# Patient Record
Sex: Male | Born: 1960 | Race: White | Hispanic: No | Marital: Single | Smoking: Never smoker
Health system: Southern US, Community
[De-identification: ages and names within clinical notes are randomized; demographics above are authoritative.]

## PROBLEM LIST (undated history)

## (undated) DIAGNOSIS — G4730 Sleep apnea, unspecified: Secondary | ICD-10-CM

## (undated) DIAGNOSIS — C801 Malignant (primary) neoplasm, unspecified: Secondary | ICD-10-CM

## (undated) DIAGNOSIS — I10 Essential (primary) hypertension: Secondary | ICD-10-CM

## (undated) DIAGNOSIS — E119 Type 2 diabetes mellitus without complications: Secondary | ICD-10-CM

## (undated) HISTORY — PX: APPENDECTOMY: SHX54

## (undated) HISTORY — PX: CHOLECYSTECTOMY: SHX55

---

## 2008-10-04 ENCOUNTER — Emergency Department (HOSPITAL_COMMUNITY): Admission: EM | Admit: 2008-10-04 | Discharge: 2008-10-04 | Payer: Self-pay | Admitting: Emergency Medicine

## 2014-08-02 ENCOUNTER — Other Ambulatory Visit: Payer: Self-pay | Admitting: Physician Assistant

## 2019-08-26 ENCOUNTER — Ambulatory Visit: Payer: Self-pay | Attending: Family

## 2019-08-26 DIAGNOSIS — Z23 Encounter for immunization: Secondary | ICD-10-CM

## 2019-08-26 NOTE — Progress Notes (Signed)
   Covid-19 Vaccination Clinic  Name:  Joseph Matthews    MRN: [id number] DOB: 1960/11/10  08/26/2019  [name] was observed post Covid-19 immunization for 15 minutes without incident. He was provided with Vaccine Information Sheet and instruction to access the V-Safe system.   [name] was instructed to call 911 with any severe reactions post vaccine: [name] Kitchen Difficulty breathing  . Swelling of face and throat  . A fast heartbeat  . A bad rash all over body  . Dizziness and weakness   Immunizations Administered    Name Date Dose VIS Date Route   Moderna COVID-19 Vaccine 08/26/2019  1:47 PM 0.5 mL 05/18/2019 Intramuscular   Manufacturer: Moderna   Lot: [id number]   [id number]

## 2019-09-28 ENCOUNTER — Ambulatory Visit: Payer: Self-pay | Attending: Family

## 2019-09-28 DIAGNOSIS — Z23 Encounter for immunization: Secondary | ICD-10-CM

## 2019-09-28 NOTE — Progress Notes (Signed)
   Covid-19 Vaccination Clinic  Name:  Joseph Matthews    MRN: [id number] DOB: Sep 11, 1960  09/28/2019  Mr. [name] was observed post Covid-19 immunization for 15 minutes without incident. He was provided with Vaccine Information Sheet and instruction to access the V-Safe system.   Mr. [name] was instructed to call 911 with any severe reactions post vaccine: [name] Kitchen Difficulty breathing  . Swelling of face and throat  . A fast heartbeat  . A bad rash all over body  . Dizziness and weakness   Immunizations Administered    Name Date Dose VIS Date Route   Moderna COVID-19 Vaccine 09/28/2019 11:13 AM 0.5 mL 05/18/2019 Intramuscular   Manufacturer: Moderna   Lot: [id number]   [place][id number]

## 2020-03-27 ENCOUNTER — Other Ambulatory Visit (HOSPITAL_COMMUNITY): Payer: Self-pay | Admitting: Urology

## 2020-03-27 DIAGNOSIS — C61 Malignant neoplasm of prostate: Secondary | ICD-10-CM

## 2020-04-04 ENCOUNTER — Other Ambulatory Visit: Payer: Self-pay

## 2020-04-04 ENCOUNTER — Encounter (HOSPITAL_COMMUNITY)
Admission: RE | Admit: 2020-04-04 | Discharge: 2020-04-04 | Disposition: A | Discharge status: Home / Self Care | Payer: BC Managed Care – PPO | Source: Ambulatory Visit | Attending: Urology | Admitting: Urology

## 2020-04-04 DIAGNOSIS — C61 Malignant neoplasm of prostate: Secondary | ICD-10-CM | POA: Insufficient documentation

## 2020-04-04 MED ORDER — TECHNETIUM TC 99M MEDRONATE IV KIT
21.1000 | PACK | Freq: Once | INTRAVENOUS | Status: AC
  Administered 2020-04-04: 21.1 via INTRAVENOUS

## 2020-04-27 ENCOUNTER — Other Ambulatory Visit: Payer: Self-pay | Admitting: Urology

## 2020-05-16 NOTE — Progress Notes (Signed)
DUE TO COVID-19 ONLY ONE VISITOR IS ALLOWED TO COME WITH YOU AND STAY IN THE WAITING ROOM ONLY DURING PRE OP AND PROCEDURE DAY OF SURGERY. THE 1 VISITOR  MAY VISIT WITH YOU AFTER SURGERY IN YOUR PRIVATE ROOM DURING VISITING HOURS ONLY!  YOU NEED TO HAVE A COVID 19 TEST ON___12/03/2020 ____ @_______ , THIS TEST MUST BE DONE BEFORE SURGERY,  COVID TESTING SITE [address] [place] [address], IT IS ON THE RIGHT GOING OUT [address] APPROXIMATELY  2 MINUTES PAST [employer] ON THE RIGHT. ONCE YOUR COVID TEST IS COMPLETED,  PLEASE BEGIN THE QUARANTINE INSTRUCTIONS AS OUTLINED IN YOUR HANDOUT.                Joseph Matthews  05/16/2020   Your procedure is scheduled on: 05/29/2020    Report to [facility] Main  Entrance   Report to admitting at    [phone]     Call this number if you have problems the morning of surgery [phone]    Remember: Do not eat food , candy gum or mints :After Midnight. You may have clear liquids from midnight until 0415AM   MAGNESIUM cITRATE- 8  OUNCES AT 12 NOON DAY BEFORE SURGERY.   FLEETS ENEMA NITE BEFORE SURGERY     CLEAR LIQUID DIET   Foods Allowed                                                                       Coffee and tea, regular and decaf                              Plain Jell-O any favor except red or purple                                            Fruit ices (not with fruit pulp)                                      Iced Popsicles                                     Carbonated beverages, regular and diet                                    Cranberry, grape and apple juices Sports drinks like Gatorade Lightly seasoned clear broth or consume(fat free) Sugar, honey syrup   _____________________________________________________________________    BRUSH YOUR TEETH MORNING OF SURGERY AND RINSE YOUR MOUTH OUT, NO CHEWING GUM CANDY OR MINTS.     Take these medicines the morning of surgery with A SIP OF WATER:  NONE   DO NOT TAKE ANY DIABETIC MEDICATIONS DAY OF YOUR SURGERY  You may not have any metal on your body including hair pins and              piercings  Do not wear jewelry, make-up, lotions, powders or perfumes, deodorant             Do not wear nail polish on your fingernails.  Do not shave  48 hours prior to surgery.              Men may shave face and neck.   Do not bring valuables to the hospital. [place].  Contacts, dentures or bridgework may not be worn into surgery.  Leave suitcase in the car. After surgery it may be brought to your room.     Patients discharged the day of surgery will not be allowed to drive home. IF YOU ARE HAVING SURGERY AND GOING HOME THE SAME DAY, YOU MUST HAVE AN ADULT TO DRIVE YOU HOME AND BE WITH YOU FOR 24 HOURS. YOU MAY GO HOME BY TAXI OR UBER OR ORTHERWISE, BUT AN ADULT MUST ACCOMPANY YOU HOME AND STAY WITH YOU FOR 24 HOURS.  Name and phone number of your driver:  Special Instructions: N/A              Please read over the following fact sheets you were given: _____________________________________________________________________  [facility] - Preparing for Surgery Before surgery, you can play an important role.  Because skin is not sterile, your skin needs to be as free of germs as possible.  You can reduce the number of germs on your skin by washing with CHG (chlorahexidine gluconate) soap before surgery.  CHG is an antiseptic cleaner which kills germs and bonds with the skin to continue killing germs even after washing. Please DO NOT use if you have an allergy to CHG or antibacterial soaps.  If your skin becomes reddened/irritated stop using the CHG and inform your nurse when you arrive at Short Stay. Do not shave (including legs and underarms) for at least 48 hours prior to the first CHG shower.  You may shave your face/neck. Please follow these instructions  carefully:  1.  Shower with CHG Soap the night before surgery and the  morning of Surgery.  2.  If you choose to wash your hair, wash your hair first as usual with your  normal  shampoo.  3.  After you shampoo, rinse your hair and body thoroughly to remove the  shampoo.                           4.  Use CHG as you would any other liquid soap.  You can apply chg directly  to the skin and wash                       Gently with a scrungie or clean washcloth.  5.  Apply the CHG Soap to your body ONLY FROM THE NECK DOWN.   Do not use on face/ open                           Wound or open sores. Avoid contact with eyes, ears mouth and genitals (private parts).  Wash face,  Genitals (private parts) with your normal soap.             6.  Wash thoroughly, paying special attention to the area where your surgery  will be performed.  7.  Thoroughly rinse your body with warm water from the neck down.  8.  DO NOT shower/wash with your normal soap after using and rinsing off  the CHG Soap.                9.  Pat yourself dry with a clean towel.            10.  Wear clean pajamas.            11.  Place clean sheets on your bed the night of your first shower and do not  sleep with pets. Day of Surgery : Do not apply any lotions/deodorants the morning of surgery.  Please wear clean clothes to the hospital/surgery center.  FAILURE TO FOLLOW THESE INSTRUCTIONS MAY RESULT IN THE CANCELLATION OF YOUR SURGERY PATIENT SIGNATURE_________________________________  NURSE SIGNATURE__________________________________  ________________________________________________________________________

## 2020-05-17 ENCOUNTER — Other Ambulatory Visit: Payer: Self-pay

## 2020-05-17 ENCOUNTER — Encounter (HOSPITAL_COMMUNITY)
Admission: RE | Admit: 2020-05-17 | Discharge: 2020-05-17 | Disposition: A | Discharge status: Home / Self Care | Payer: BC Managed Care – PPO | Source: Ambulatory Visit | Attending: Urology | Admitting: Urology

## 2020-05-17 ENCOUNTER — Encounter (HOSPITAL_COMMUNITY): Payer: Self-pay

## 2020-05-17 DIAGNOSIS — Z01818 Encounter for other preprocedural examination: Secondary | ICD-10-CM | POA: Insufficient documentation

## 2020-05-17 HISTORY — DX: Type 2 diabetes mellitus without complications: E11.9

## 2020-05-17 HISTORY — DX: Essential (primary) hypertension: I10

## 2020-05-17 HISTORY — DX: Malignant (primary) neoplasm, unspecified: C80.1

## 2020-05-17 HISTORY — DX: Sleep apnea, unspecified: G47.30

## 2020-05-17 LAB — CBC
HCT: 44.7 % (ref 39.0–52.0)
Hemoglobin: 15.4 g/dL (ref 13.0–17.0)
MCH: 29.8 pg (ref 26.0–34.0)
MCHC: 34.5 g/dL (ref 30.0–36.0)
MCV: 86.6 fL (ref 80.0–100.0)
Platelets: 216 10*3/uL (ref 150–400)
RBC: 5.16 MIL/uL (ref 4.22–5.81)
RDW: 12.8 % (ref 11.5–15.5)
WBC: 7.5 10*3/uL (ref 4.0–10.5)
nRBC: 0 % (ref 0.0–0.2)

## 2020-05-17 LAB — BASIC METABOLIC PANEL
Anion gap: 9 (ref 5–15)
BUN: 17 mg/dL (ref 6–20)
CO2: 28 mmol/L (ref 22–32)
Calcium: 8.9 mg/dL (ref 8.9–10.3)
Chloride: 104 mmol/L (ref 98–111)
Creatinine, Ser: 1.33 mg/dL — ABNORMAL HIGH (ref 0.61–1.24)
GFR, Estimated: >60 mL/min (ref >60)
Glucose, Bld: 127 mg/dL — ABNORMAL HIGH (ref 70–99)
Potassium: 3.7 mmol/L (ref 3.5–5.1)
Sodium: 141 mmol/L (ref 135–145)

## 2020-05-17 LAB — GLUCOSE, CAPILLARY: Glucose-Capillary: 106 mg/dL — ABNORMAL HIGH (ref 70–99)

## 2020-05-17 LAB — HEMOGLOBIN A1C
Hgb A1c MFr Bld: 5.1 % (ref 4.8–5.6)
Mean Plasma Glucose: 99.67 mg/dL

## 2020-05-17 LAB — TYPE AND SCREEN
ABO/RH(D): O POS
Antibody Screen: NEGATIVE

## 2020-05-25 ENCOUNTER — Other Ambulatory Visit (HOSPITAL_COMMUNITY)
Admission: RE | Admit: 2020-05-25 | Discharge: 2020-05-25 | Disposition: A | Discharge status: Home / Self Care | Payer: BC Managed Care – PPO | Source: Ambulatory Visit | Attending: Urology | Admitting: Urology

## 2020-05-25 DIAGNOSIS — Z01812 Encounter for preprocedural laboratory examination: Secondary | ICD-10-CM | POA: Diagnosis not present

## 2020-05-25 DIAGNOSIS — Z20822 Contact with and (suspected) exposure to covid-19: Secondary | ICD-10-CM | POA: Diagnosis not present

## 2020-05-25 LAB — SARS CORONAVIRUS 2 (TAT 6-24 HRS): SARS Coronavirus 2: NEGATIVE

## 2020-05-26 NOTE — H&P (Signed)
CC: Prostate Cancer     Joseph Matthews is a 59 year old gentleman who had a PSA in 2016 that was 5.34. However, he was told by his primary care physician that this was not abnormal in should just be checked in 5 years. 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   Family history: None.   Imaging studies:  CT scan (04/04/20): No evidence of metastatic disease. 3.4 cm hyperdense prostatic mass.  Bone scan(04/04/20): No evidence of metastatic disease.   PMH: He has a history of diabetes, hypertension, and sleep apnea.  PSH: He has a history of a ruptured appendix in underwent an open midline surgery for treatment and appendectomy. He also has more recently had a laparoscopic cholecystectomy in apparently had extensive adhesions requiring adhesiolysis. He did have a bowel injury that subsequent resulted in open repair.   TNM stage: cT1c N0 M0  PSA: 17.3  Gleason score: 3+4=7 (GG 2)  Biopsy (03/10/20): 5/12 cores positive  Left: L lateral apex (25%, 3+4=7), L lateral mid (21%, 3+4=7)  Right: R apex (45%, 3+4=7), R lateral apex (25%, 3+4=7), R mid (17%, 3+4=7)  Prostate volume: 29.8 cc  PSAD: 0.58 a   Nomogram  OC disease: 41%  EPE: 58%  SVI: 7%  LNI: 8%  PFS (5 year, 10 year): 69%, 54%   Urinary function: IPSS is 6.  Erectile function: SHIM score is 13. He can achieve erections adequate for intercourse approximately 50% of the time. This is improved to 75% of the time when he takes Viagra.     ALLERGIES: hydrochlorothiazide - gout penicillin - Skin Rash    MEDICATIONS: Levofloxacin 750 mg tablet 1 tablet PO Morning of procedure  Metformin Hcl  Viagra 100 mg tablet 1 tablet PO prn  Amlodipine-Olmesartan  Bystolic 2.5 mg tablet     GU PSH: Locm 300-399Mg /Ml Iodine,1Ml -  03/29/2020 Prostate Needle Biopsy - 03/10/2020     NON-GU PSH: Appendectomy - 1994 Remove Gallbladder - 1999 Surgical Pathology, Gross And Microscopic Examination For Prostate Needle - 03/10/2020     GU PMH: Prostate Cancer - 03/29/2020, Favorable intermediate risk prostate cancer. Delayed biopsy due to patient reluctance in the past. He has not had any imaging studies yet., - 03/22/2020 Elevated PSA - 03/10/2020, Normal digital rectal exam but his PSA from 5 years ago was over 5. That he knows of, this has not been repeated. , - 01/10/2020 ED due to arterial insufficiency, Bothersome to him. - 01/10/2020    NON-GU PMH: Diabetes Type 2 Hypertension Sleep Apnea    FAMILY HISTORY: mother deceased - Other Patient's father is still living - Other   SOCIAL HISTORY: Marital Status: Single Current Smoking Status: Patient has never smoked.   Tobacco Use Assessment Completed: Used Tobacco in last 30 days? Social Drinker.  Drinks 2 caffeinated drinks per day. Patient's occupation Agricultural engineer.    REVIEW OF SYSTEMS:    GU Review Male:   Patient denies frequent urination, hard to postpone urination, burning/ pain with urination, get up at night to urinate, leakage of urine, stream starts and stops, trouble starting your streams, and have to strain to urinate .  Gastrointestinal (Upper):   Patient denies nausea and vomiting.  Gastrointestinal (Lower):   Patient denies diarrhea and constipation.  Constitutional:   Patient denies fever, night sweats, weight loss, and fatigue.  Skin:   Patient denies skin rash/ lesion and itching.  Eyes:   Patient denies blurred vision and double vision.  Ears/ Nose/ Throat:   Patient denies sore throat and sinus problems.  Hematologic/Lymphatic:   Patient denies swollen glands and easy bruising.  Cardiovascular:   Patient denies leg swelling and chest pains.  Respiratory:   Patient denies cough and shortness of breath.  Endocrine:   Patient denies  excessive thirst.  Musculoskeletal:   Patient denies back pain and joint pain.  Neurological:   Patient denies headaches and dizziness.  Psychologic:   Patient denies depression and anxiety.   VITAL SIGNS:     Weight 200 lb / 90.72 kg  Height 70 in / 177.8 cm  BP 146/80 mmHg  BMI 28.7 kg/m   MULTI-SYSTEM PHYSICAL EXAMINATION:    Constitutional: Well-nourished. No physical deformities. Normally developed. Good grooming.  Neck: Neck symmetrical, not swollen. Normal tracheal position.  Respiratory: No labored breathing, no use of accessory muscles. Clear bilaterally.  Cardiovascular: Normal temperature, normal extremity pulses, no swelling, no varicosities. Regular rate and rhythm.  Lymphatic: No enlargement of neck, axillae, groin.  Skin: No paleness, no jaundice, no cyanosis. No lesion, no ulcer, no rash.  Neurologic / Psychiatric: Oriented to time, oriented to place, oriented to person. No depression, no anxiety, no agitation.  Gastrointestinal: No mass, no tenderness, no rigidity, non obese abdomen. He has a well-healed lower midline incision extending to just above the umbilicus.  Eyes: Normal conjunctivae. Normal eyelids.  Ears, Nose, Mouth, and Throat: Left ear no scars, no lesions, no masses. Right ear no scars, no lesions, no masses. Nose no scars, no lesions, no masses. Normal hearing. Normal lips.  Musculoskeletal: Normal gait and station of head and neck.      ASSESSMENT:      ICD-10 Details  1 GU:   Prostate Cancer - C61    PLAN:     1. Intermediate risk prostate cancer:   He has confirmed his decision to proceed with surgical therapy. He will be scheduled for a bilateral nerve-sparing robot assisted laparoscopic radical prostatectomy and bilateral pelvic lymphadenectomy. He understands the substantial risk of requiring open surgical conversion considering his prior surgical history.

## 2020-05-29 ENCOUNTER — Other Ambulatory Visit: Payer: Self-pay

## 2020-05-29 ENCOUNTER — Encounter (HOSPITAL_COMMUNITY): Payer: Self-pay | Admitting: Urology

## 2020-05-29 ENCOUNTER — Observation Stay (HOSPITAL_COMMUNITY)
Admission: RE | Admit: 2020-05-29 | Discharge: 2020-05-30 | Disposition: A | Discharge status: Home / Self Care | Payer: BC Managed Care – PPO | Attending: Urology | Admitting: Urology

## 2020-05-29 ENCOUNTER — Encounter (HOSPITAL_COMMUNITY)
Admission: RE | Disposition: A | Discharge status: Home / Self Care | Payer: Self-pay | Source: Home / Self Care | Attending: Urology

## 2020-05-29 ENCOUNTER — Ambulatory Visit (HOSPITAL_COMMUNITY): Payer: BC Managed Care – PPO | Admitting: Anesthesiology

## 2020-05-29 DIAGNOSIS — E119 Type 2 diabetes mellitus without complications: Secondary | ICD-10-CM | POA: Diagnosis not present

## 2020-05-29 DIAGNOSIS — I10 Essential (primary) hypertension: Secondary | ICD-10-CM | POA: Insufficient documentation

## 2020-05-29 DIAGNOSIS — C61 Malignant neoplasm of prostate: Secondary | ICD-10-CM | POA: Diagnosis not present

## 2020-05-29 DIAGNOSIS — Z79899 Other long term (current) drug therapy: Secondary | ICD-10-CM | POA: Insufficient documentation

## 2020-05-29 DIAGNOSIS — Z7984 Long term (current) use of oral hypoglycemic drugs: Secondary | ICD-10-CM | POA: Insufficient documentation

## 2020-05-29 HISTORY — PX: LYMPHADENECTOMY: SHX5960

## 2020-05-29 HISTORY — PX: ROBOT ASSISTED LAPAROSCOPIC RADICAL PROSTATECTOMY: SHX5141

## 2020-05-29 LAB — GLUCOSE, CAPILLARY
Glucose-Capillary: 122 mg/dL — ABNORMAL HIGH (ref 70–99)
Glucose-Capillary: 191 mg/dL — ABNORMAL HIGH (ref 70–99)
Glucose-Capillary: 154 mg/dL — ABNORMAL HIGH (ref 70–99)
Glucose-Capillary: 150 mg/dL — ABNORMAL HIGH (ref 70–99)

## 2020-05-29 LAB — HEMOGLOBIN AND HEMATOCRIT, BLOOD
HCT: 45.4 % (ref 39.0–52.0)
Hemoglobin: 15.9 g/dL (ref 13.0–17.0)

## 2020-05-29 LAB — ABO/RH: ABO/RH(D): O POS

## 2020-05-29 SURGERY — XI ROBOTIC ASSISTED LAPAROSCOPIC RADICAL PROSTATECTOMY LEVEL 3
Anesthesia: General | Site: Abdomen

## 2020-05-29 MED ORDER — SUGAMMADEX SODIUM 200 MG/2ML IV SOLN
INTRAVENOUS | Status: DC | PRN
  Administered 2020-05-29: 200 mg via INTRAVENOUS

## 2020-05-29 MED ORDER — LACTATED RINGERS IV SOLN
INTRAVENOUS | Status: DC | PRN
  Administered 2020-05-29: 08:00:00 1 mL

## 2020-05-29 MED ORDER — HYDROMORPHONE HCL 1 MG/ML IJ SOLN
0.2500 mg | INTRAMUSCULAR | Status: DC | PRN
  Administered 2020-05-29: 0.25 mg via INTRAVENOUS

## 2020-05-29 MED ORDER — LIDOCAINE HCL (CARDIAC) PF 100 MG/5ML IV SOSY
PREFILLED_SYRINGE | INTRAVENOUS | Status: DC | PRN
  Administered 2020-05-29: 100 mg via INTRAVENOUS

## 2020-05-29 MED ORDER — STERILE WATER FOR IRRIGATION IR SOLN
Status: DC | PRN
  Administered 2020-05-29: 1000 mL

## 2020-05-29 MED ORDER — EPHEDRINE SULFATE-NACL 50-0.9 MG/10ML-% IV SOSY
PREFILLED_SYRINGE | INTRAVENOUS | Status: DC | PRN
  Administered 2020-05-29 (×6): 5 mg via INTRAVENOUS

## 2020-05-29 MED ORDER — DOCUSATE SODIUM 100 MG PO CAPS
100.0000 mg | ORAL_CAPSULE | Freq: Two times a day (BID) | ORAL | Status: DC
  Administered 2020-05-29 – 2020-05-30 (×2): 100 mg via ORAL
  Filled 2020-05-29 (×2): qty 1

## 2020-05-29 MED ORDER — CHLORHEXIDINE GLUCONATE 0.12 % MT SOLN
15.0000 mL | Freq: Two times a day (BID) | OROMUCOSAL | Status: DC
  Administered 2020-05-30: 10:00:00 15 mL via OROMUCOSAL
  Filled 2020-05-29: qty 15

## 2020-05-29 MED ORDER — CHLORHEXIDINE GLUCONATE CLOTH 2 % EX PADS
6.0000 | MEDICATED_PAD | Freq: Every day | CUTANEOUS | Status: DC
  Administered 2020-05-29 – 2020-05-30 (×2): 6 via TOPICAL

## 2020-05-29 MED ORDER — AMLODIPINE BESYLATE 10 MG PO TABS
10.0000 mg | ORAL_TABLET | Freq: Every day | ORAL | Status: DC
  Administered 2020-05-29 – 2020-05-30 (×2): 10 mg via ORAL
  Filled 2020-05-29 (×2): qty 1

## 2020-05-29 MED ORDER — DEXAMETHASONE SODIUM PHOSPHATE 10 MG/ML IJ SOLN
INTRAMUSCULAR | Status: AC
  Filled 2020-05-29: qty 1

## 2020-05-29 MED ORDER — SODIUM CHLORIDE 0.9 % IR SOLN
Status: DC | PRN
  Administered 2020-05-29: 1000 mL via INTRAVESICAL

## 2020-05-29 MED ORDER — HEPARIN SODIUM (PORCINE) 1000 UNIT/ML IJ SOLN
INTRAMUSCULAR | Status: AC
  Filled 2020-05-29: qty 1

## 2020-05-29 MED ORDER — CEFAZOLIN SODIUM-DEXTROSE 2-4 GM/100ML-% IV SOLN
INTRAVENOUS | Status: AC
  Filled 2020-05-29: qty 100

## 2020-05-29 MED ORDER — INDIGOTINDISULFONATE SODIUM 8 MG/ML IJ SOLN
INTRAMUSCULAR | Status: AC
  Filled 2020-05-29: qty 5

## 2020-05-29 MED ORDER — EPHEDRINE 5 MG/ML INJ
INTRAVENOUS | Status: AC
  Filled 2020-05-29: qty 10

## 2020-05-29 MED ORDER — PROPOFOL 10 MG/ML IV BOLUS
INTRAVENOUS | Status: DC | PRN
Start: 2020-05-29 — End: 2020-05-29
  Administered 2020-05-29: 160 mg via INTRAVENOUS

## 2020-05-29 MED ORDER — BUPIVACAINE-EPINEPHRINE (PF) 0.25% -1:200000 IJ SOLN
INTRAMUSCULAR | Status: AC
  Filled 2020-05-29: qty 30

## 2020-05-29 MED ORDER — TRAMADOL HCL 50 MG PO TABS: 50.0000 mg | ORAL_TABLET | Freq: Four times a day (QID) | ORAL | 0 refills | Status: AC | PRN

## 2020-05-29 MED ORDER — AMLODIPINE-OLMESARTAN 10-40 MG PO TABS: 1.0000 | ORAL_TABLET | Freq: Every day | ORAL | Status: DC

## 2020-05-29 MED ORDER — SULFAMETHOXAZOLE-TRIMETHOPRIM 800-160 MG PO TABS: 1.0000 | ORAL_TABLET | Freq: Two times a day (BID) | ORAL | 0 refills | Status: AC

## 2020-05-29 MED ORDER — NEBIVOLOL HCL 5 MG PO TABS
5.0000 mg | ORAL_TABLET | Freq: Every evening | ORAL | Status: DC
  Administered 2020-05-29: 18:00:00 5 mg via ORAL
  Filled 2020-05-29 (×2): qty 1

## 2020-05-29 MED ORDER — SUCCINYLCHOLINE CHLORIDE 200 MG/10ML IV SOSY
PREFILLED_SYRINGE | INTRAVENOUS | Status: DC | PRN
  Administered 2020-05-29: 120 mg via INTRAVENOUS

## 2020-05-29 MED ORDER — CEFAZOLIN SODIUM-DEXTROSE 1-4 GM/50ML-% IV SOLN
1.0000 g | Freq: Three times a day (TID) | INTRAVENOUS | Status: AC
  Administered 2020-05-29 – 2020-05-30 (×2): 1 g via INTRAVENOUS
  Filled 2020-05-29 (×2): qty 50

## 2020-05-29 MED ORDER — DEXAMETHASONE SODIUM PHOSPHATE 10 MG/ML IJ SOLN
INTRAMUSCULAR | Status: DC | PRN
  Administered 2020-05-29: 8 mg via INTRAVENOUS

## 2020-05-29 MED ORDER — CEFAZOLIN SODIUM-DEXTROSE 2-4 GM/100ML-% IV SOLN
2.0000 g | Freq: Once | INTRAVENOUS | Status: AC
  Administered 2020-05-29 (×2): 2 g via INTRAVENOUS

## 2020-05-29 MED ORDER — ORAL CARE MOUTH RINSE
15.0000 mL | Freq: Two times a day (BID) | OROMUCOSAL | Status: DC
  Administered 2020-05-30: 13:00:00 15 mL via OROMUCOSAL

## 2020-05-29 MED ORDER — POTASSIUM CHLORIDE IN NACL 20-0.45 MEQ/L-% IV SOLN
INTRAVENOUS | Status: DC
  Filled 2020-05-29 (×3): qty 1000

## 2020-05-29 MED ORDER — IRBESARTAN 300 MG PO TABS
300.0000 mg | ORAL_TABLET | Freq: Every day | ORAL | Status: DC
Start: 2020-05-29 — End: 2020-05-30
  Administered 2020-05-29 – 2020-05-30 (×2): 300 mg via ORAL
  Filled 2020-05-29 (×2): qty 1

## 2020-05-29 MED ORDER — KETOROLAC TROMETHAMINE 15 MG/ML IJ SOLN
15.0000 mg | Freq: Four times a day (QID) | INTRAMUSCULAR | Status: DC
  Administered 2020-05-29 – 2020-05-30 (×4): 15 mg via INTRAVENOUS
  Filled 2020-05-29 (×4): qty 1

## 2020-05-29 MED ORDER — LIDOCAINE HCL (PF) 2 % IJ SOLN
INTRAMUSCULAR | Status: AC
  Filled 2020-05-29: qty 5

## 2020-05-29 MED ORDER — DIPHENHYDRAMINE HCL 12.5 MG/5ML PO ELIX: 12.5000 mg | ORAL_SOLUTION | Freq: Four times a day (QID) | ORAL | Status: DC | PRN

## 2020-05-29 MED ORDER — FENTANYL CITRATE (PF) 250 MCG/5ML IJ SOLN
INTRAMUSCULAR | Status: AC
  Filled 2020-05-29: qty 5

## 2020-05-29 MED ORDER — PROPOFOL 10 MG/ML IV BOLUS
INTRAVENOUS | Status: AC
  Filled 2020-05-29: qty 40

## 2020-05-29 MED ORDER — ZOLPIDEM TARTRATE 5 MG PO TABS: 5.0000 mg | ORAL_TABLET | Freq: Every evening | ORAL | Status: DC | PRN

## 2020-05-29 MED ORDER — FLEET ENEMA 7-19 GM/118ML RE ENEM
1.0000 | ENEMA | Freq: Once | RECTAL | Status: DC
  Filled 2020-05-29: qty 1

## 2020-05-29 MED ORDER — ORAL CARE MOUTH RINSE: 15.0000 mL | Freq: Once | OROMUCOSAL | Status: AC

## 2020-05-29 MED ORDER — CHLORHEXIDINE GLUCONATE 0.12 % MT SOLN
15.0000 mL | Freq: Once | OROMUCOSAL | Status: AC
  Administered 2020-05-29: 06:00:00 15 mL via OROMUCOSAL

## 2020-05-29 MED ORDER — BELLADONNA ALKALOIDS-OPIUM 16.2-60 MG RE SUPP: 1.0000 | Freq: Four times a day (QID) | RECTAL | Status: DC | PRN

## 2020-05-29 MED ORDER — ROCURONIUM BROMIDE 10 MG/ML (PF) SYRINGE
PREFILLED_SYRINGE | INTRAVENOUS | Status: AC
  Filled 2020-05-29: qty 10

## 2020-05-29 MED ORDER — LACTATED RINGERS IV SOLN
INTRAVENOUS | Status: DC
  Administered 2020-05-29: 15:00:00 1000 mL via INTRAVENOUS

## 2020-05-29 MED ORDER — ONDANSETRON HCL 4 MG/2ML IJ SOLN
INTRAMUSCULAR | Status: AC
  Filled 2020-05-29: qty 2

## 2020-05-29 MED ORDER — MAGNESIUM CITRATE PO SOLN
1.0000 | Freq: Once | ORAL | Status: DC
  Filled 2020-05-29: qty 296

## 2020-05-29 MED ORDER — MIDAZOLAM HCL 2 MG/2ML IJ SOLN
INTRAMUSCULAR | Status: AC
  Filled 2020-05-29: qty 2

## 2020-05-29 MED ORDER — SUCCINYLCHOLINE CHLORIDE 200 MG/10ML IV SOSY
PREFILLED_SYRINGE | INTRAVENOUS | Status: AC
  Filled 2020-05-29: qty 10

## 2020-05-29 MED ORDER — ACETAMINOPHEN 325 MG PO TABS: 650.0000 mg | ORAL_TABLET | ORAL | Status: DC | PRN

## 2020-05-29 MED ORDER — SODIUM CHLORIDE (PF) 0.9 % IJ SOLN
INTRAMUSCULAR | Status: AC
  Filled 2020-05-29: qty 10

## 2020-05-29 MED ORDER — DIPHENHYDRAMINE HCL 50 MG/ML IJ SOLN: 12.5000 mg | Freq: Four times a day (QID) | INTRAMUSCULAR | Status: DC | PRN

## 2020-05-29 MED ORDER — HYDROMORPHONE HCL 1 MG/ML IJ SOLN
0.5000 mg | INTRAMUSCULAR | Status: DC | PRN
  Administered 2020-05-29: 0.5 mg via INTRAVENOUS
  Filled 2020-05-29: qty 1

## 2020-05-29 MED ORDER — BACITRACIN-NEOMYCIN-POLYMYXIN 400-5-5000 EX OINT: 1.0000 "application " | TOPICAL_OINTMENT | Freq: Three times a day (TID) | CUTANEOUS | Status: DC | PRN

## 2020-05-29 MED ORDER — SODIUM CHLORIDE 0.9 % IV BOLUS
1000.0000 mL | Freq: Once | INTRAVENOUS | Status: AC
  Administered 2020-05-29: 13:00:00 1000 mL via INTRAVENOUS

## 2020-05-29 MED ORDER — MORPHINE SULFATE (PF) 2 MG/ML IV SOLN: 2.0000 mg | INTRAVENOUS | Status: DC | PRN

## 2020-05-29 MED ORDER — ONDANSETRON HCL 4 MG/2ML IJ SOLN
INTRAMUSCULAR | Status: DC | PRN
  Administered 2020-05-29: 4 mg via INTRAVENOUS

## 2020-05-29 MED ORDER — BUPIVACAINE-EPINEPHRINE 0.25% -1:200000 IJ SOLN
INTRAMUSCULAR | Status: DC | PRN
  Administered 2020-05-29: 30 mL

## 2020-05-29 MED ORDER — MIDAZOLAM HCL 2 MG/2ML IJ SOLN
INTRAMUSCULAR | Status: DC | PRN
  Administered 2020-05-29: 2 mg via INTRAVENOUS

## 2020-05-29 MED ORDER — FENTANYL CITRATE (PF) 250 MCG/5ML IJ SOLN
INTRAMUSCULAR | Status: DC | PRN
  Administered 2020-05-29: 100 ug via INTRAVENOUS
  Administered 2020-05-29 (×2): 50 ug via INTRAVENOUS
  Administered 2020-05-29: 100 ug via INTRAVENOUS
  Administered 2020-05-29: 25 ug via INTRAVENOUS

## 2020-05-29 MED ORDER — HYDROMORPHONE HCL 1 MG/ML IJ SOLN
INTRAMUSCULAR | Status: AC
  Administered 2020-05-29: 14:00:00 0.25 mg via INTRAVENOUS
  Filled 2020-05-29: qty 1

## 2020-05-29 MED ORDER — ONDANSETRON HCL 4 MG/2ML IJ SOLN: 4.0000 mg | INTRAMUSCULAR | Status: DC | PRN

## 2020-05-29 MED ORDER — INSULIN ASPART 100 UNIT/ML ~~LOC~~ SOLN: 0.0000 [IU] | SUBCUTANEOUS | Status: DC

## 2020-05-29 MED ORDER — ROCURONIUM BROMIDE 10 MG/ML (PF) SYRINGE
PREFILLED_SYRINGE | INTRAVENOUS | Status: DC | PRN
  Administered 2020-05-29 (×2): 10 mg via INTRAVENOUS
  Administered 2020-05-29: 30 mg via INTRAVENOUS
  Administered 2020-05-29: 20 mg via INTRAVENOUS
  Administered 2020-05-29: 80 mg via INTRAVENOUS
  Administered 2020-05-29: 10 mg via INTRAVENOUS

## 2020-05-29 SURGICAL SUPPLY — 102 items
APPLICATOR COTTON TIP 6 STRL (MISCELLANEOUS) ×3 IMPLANT
APPLICATOR COTTON TIP 6IN STRL (MISCELLANEOUS) ×5
BAG URINE DRAIN 2000ML AR STRL (UROLOGICAL SUPPLIES) ×5 IMPLANT
BLADE EXTENDED COATED 6.5IN (ELECTRODE) ×5 IMPLANT
BLADE HEX COATED 2.75 (ELECTRODE) ×5 IMPLANT
CATH FOLEY 2WAY SLVR  5CC 20FR (CATHETERS) ×2
CATH FOLEY 2WAY SLVR 18FR 30CC (CATHETERS) ×5 IMPLANT
CATH FOLEY 2WAY SLVR 30CC 20FR (CATHETERS) ×5 IMPLANT
CATH FOLEY 2WAY SLVR 5CC 20FR (CATHETERS) ×3 IMPLANT
CATH ROBINSON RED A/P 16FR (CATHETERS) ×5 IMPLANT
CATH ROBINSON RED A/P 8FR (CATHETERS) ×5 IMPLANT
CATH TIEMANN FOLEY 18FR 5CC (CATHETERS) ×5 IMPLANT
CHLORAPREP W/TINT 26 (MISCELLANEOUS) ×5 IMPLANT
CLIP VESOCCLUDE LG 6/CT (CLIP) IMPLANT
CLIP VESOLOCK LG 6/CT PURPLE (CLIP) ×25 IMPLANT
CLIP VESOLOCK MED LG 6/CT (CLIP) ×30 IMPLANT
COVER BACK TABLE 60X90IN (DRAPES) ×5 IMPLANT
COVER SURGICAL LIGHT HANDLE (MISCELLANEOUS) ×5 IMPLANT
COVER TIP SHEARS 8 DVNC (MISCELLANEOUS) ×3 IMPLANT
COVER TIP SHEARS 8MM DA VINCI (MISCELLANEOUS) ×2
COVER WAND RF STERILE (DRAPES) IMPLANT
CUTTER ECHEON FLEX ENDO 45 340 (ENDOMECHANICALS) ×5 IMPLANT
DECANTER SPIKE VIAL GLASS SM (MISCELLANEOUS) ×5 IMPLANT
DERMABOND ADVANCED (GAUZE/BANDAGES/DRESSINGS) ×2
DERMABOND ADVANCED .7 DNX12 (GAUZE/BANDAGES/DRESSINGS) ×3 IMPLANT
DISSECTOR ROUND CHERRY 3/8 STR (MISCELLANEOUS) ×5 IMPLANT
DRAIN CHANNEL RND F F (WOUND CARE) ×5 IMPLANT
DRAPE ARM DVNC X/XI (DISPOSABLE) ×12 IMPLANT
DRAPE COLUMN DVNC XI (DISPOSABLE) ×3 IMPLANT
DRAPE DA VINCI XI ARM (DISPOSABLE) ×8
DRAPE DA VINCI XI COLUMN (DISPOSABLE) ×2
DRAPE LAPAROTOMY T 102X78X121 (DRAPES) ×5 IMPLANT
DRAPE SURG IRRIG POUCH 19X23 (DRAPES) ×5 IMPLANT
DRAPE WARM FLUID 44X44 (DRAPES) ×5 IMPLANT
DRSG TEGADERM 4X4.75 (GAUZE/BANDAGES/DRESSINGS) ×10 IMPLANT
ELECT PENCIL ROCKER SW 15FT (MISCELLANEOUS) IMPLANT
ELECT REM PT RETURN 15FT ADLT (MISCELLANEOUS) ×5 IMPLANT
EVACUATOR SILICONE 100CC (DRAIN) ×5 IMPLANT
GAUZE 4X4 16PLY RFD (DISPOSABLE) ×10 IMPLANT
GAUZE SPONGE 4X4 12PLY STRL (GAUZE/BANDAGES/DRESSINGS) ×5 IMPLANT
GLOVE BIO SURGEON STRL SZ 6.5 (GLOVE) ×4 IMPLANT
GLOVE BIO SURGEONS STRL SZ 6.5 (GLOVE) ×1
GLOVE BIOGEL M STRL SZ7.5 (GLOVE) ×10 IMPLANT
GOWN STRL REUS W/TWL LRG LVL3 (GOWN DISPOSABLE) ×15 IMPLANT
HEMOSTAT SURGICEL 4X8 (HEMOSTASIS) IMPLANT
HOLDER FOLEY CATH W/STRAP (MISCELLANEOUS) ×5 IMPLANT
IRRIG SUCT STRYKERFLOW 2 WTIP (MISCELLANEOUS) ×5
IRRIGATION SUCT STRKRFLW 2 WTP (MISCELLANEOUS) ×3 IMPLANT
IV LACTATED RINGERS 1000ML (IV SOLUTION) ×5 IMPLANT
KIT BASIN OR (CUSTOM PROCEDURE TRAY) ×5 IMPLANT
KIT TURNOVER KIT A (KITS) IMPLANT
NDL SAFETY ECLIPSE 18X1.5 (NEEDLE) ×3 IMPLANT
NEEDLE HYPO 18GX1.5 SHARP (NEEDLE) ×2
NS IRRIG 1000ML POUR BTL (IV SOLUTION) ×10 IMPLANT
PACK GENERAL/GYN (CUSTOM PROCEDURE TRAY) ×5 IMPLANT
PACK ROBOT UROLOGY CUSTOM (CUSTOM PROCEDURE TRAY) ×5 IMPLANT
PENCIL SMOKE EVACUATOR (MISCELLANEOUS) IMPLANT
PLUG CATH AND CAP STER (CATHETERS) ×5 IMPLANT
SCISSORS LAP 5X35 DISP (ENDOMECHANICALS) ×5 IMPLANT
SEAL CANN UNIV 5-8 DVNC XI (MISCELLANEOUS) ×12 IMPLANT
SEAL XI 5MM-8MM UNIVERSAL (MISCELLANEOUS) ×8
SET TUBE SMOKE EVAC HIGH FLOW (TUBING) ×5 IMPLANT
SOLUTION ELECTROLUBE (MISCELLANEOUS) ×5 IMPLANT
SPONGE LAP 18X18 RF (DISPOSABLE) ×10 IMPLANT
SPONGE LAP 4X18 RFD (DISPOSABLE) ×5 IMPLANT
STAPLE RELOAD 45 GRN (STAPLE) ×3 IMPLANT
STAPLE RELOAD 45MM GREEN (STAPLE) ×2
STAPLER VISISTAT 35W (STAPLE) ×5 IMPLANT
SUT CHROMIC 3 0 SH 27 (SUTURE) ×10 IMPLANT
SUT ETHILON 3 0 PS 1 (SUTURE) ×5 IMPLANT
SUT MNCRL 3 0 RB1 (SUTURE) ×3 IMPLANT
SUT MNCRL 3 0 VIOLET RB1 (SUTURE) ×3 IMPLANT
SUT MNCRL AB 4-0 PS2 18 (SUTURE) ×10 IMPLANT
SUT MONOCRYL 3 0 RB1 (SUTURE) ×4
SUT NOVA NAB DX-16 0-1 5-0 T12 (SUTURE) ×5 IMPLANT
SUT PDS AB 0 CTX 60 (SUTURE) ×10 IMPLANT
SUT SILK 0 (SUTURE) ×4
SUT SILK 0 30XBRD TIE 6 (SUTURE) ×6 IMPLANT
SUT SILK 2 0 30  PSL (SUTURE) ×2
SUT SILK 2 0 30 PSL (SUTURE) ×3 IMPLANT
SUT SILK 3 0 SH 30 (SUTURE) ×10 IMPLANT
SUT VIC AB 0 CT1 27 (SUTURE) ×2
SUT VIC AB 0 CT1 27XBRD ANTBC (SUTURE) ×3 IMPLANT
SUT VIC AB 0 UR5 27 (SUTURE) ×5 IMPLANT
SUT VIC AB 1 CT1 27 (SUTURE) ×2
SUT VIC AB 1 CT1 27XBRD ANTBC (SUTURE) ×3 IMPLANT
SUT VIC AB 2-0 SH 27 (SUTURE) ×2
SUT VIC AB 2-0 SH 27X BRD (SUTURE) ×3 IMPLANT
SUT VIC AB 2-0 UR5 27 (SUTURE) IMPLANT
SUT VIC AB 3-0 SH 27 (SUTURE) ×4
SUT VIC AB 3-0 SH 27XBRD (SUTURE) ×6 IMPLANT
SUT VICRYL 0 UR6 27IN ABS (SUTURE) ×10 IMPLANT
SYR 27GX1/2 1ML LL SAFETY (SYRINGE) ×5 IMPLANT
SYR 30ML LL (SYRINGE) ×5 IMPLANT
SYR TOOMEY IRRIG 70ML (MISCELLANEOUS) ×5
SYRINGE TOOMEY IRRIG 70ML (MISCELLANEOUS) ×3 IMPLANT
TAPE CLOTH SOFT 2X10 (GAUZE/BANDAGES/DRESSINGS) ×5 IMPLANT
TAPE UMBILICAL COTTON 1/8X30 (MISCELLANEOUS) IMPLANT
TOWEL OR 17X26 10 PK STRL BLUE (TOWEL DISPOSABLE) ×5 IMPLANT
TOWEL OR NON WOVEN STRL DISP B (DISPOSABLE) ×5 IMPLANT
TROCAR XCEL NON-BLD 5MMX100MML (ENDOMECHANICALS) IMPLANT
WATER STERILE IRR 1000ML POUR (IV SOLUTION) ×5 IMPLANT

## 2020-05-29 NOTE — Anesthesia Postprocedure Evaluation (Signed)
Anesthesia Post Note  Patient: Joseph Matthews  Procedure(s) Performed: XI ROBOTIC ASSISTED LAPAROSCOPIC RADICAL PROSTATECTOMY LEVEL 3, LYSIS OF ADHESIONS, PRIMARY VENTRAL HERNIA REPAIR (N/A Abdomen) LYMPHADENECTOMY, PELVIC (Bilateral )     Patient location during evaluation: PACU Anesthesia Type: General Level of consciousness: awake Pain management: pain level controlled Vital Signs Assessment: post-procedure vital signs reviewed and stable Respiratory status: spontaneous breathing Cardiovascular status: stable Postop Assessment: no apparent nausea or vomiting Anesthetic complications: no   No complications documented.  Last Vitals:  Vitals:   05/29/20 1330 05/29/20 1345  BP: (!) 168/92 (!) 157/87  Pulse: 86 84  Resp: 16 17  Temp:    SpO2: 100% 93%    Last Pain:  Vitals:   05/29/20 1345  TempSrc:   PainSc: 0-No pain                 Anastazja Isaac

## 2020-05-29 NOTE — Transfer of Care (Signed)
Immediate Anesthesia Transfer of Care Note  Patient: Joseph Matthews  Procedure(s) Performed: XI ROBOTIC ASSISTED LAPAROSCOPIC RADICAL PROSTATECTOMY LEVEL 3, LYSIS OF ADHESIONS, PRIMARY VENTRAL HERNIA REPAIR (N/A Abdomen) LYMPHADENECTOMY, PELVIC (Bilateral )  Patient Location: PACU  Anesthesia Type:General  Level of Consciousness: drowsy and patient cooperative  Airway & Oxygen Therapy: Patient Spontanous Breathing and Patient connected to face mask oxygen  Post-op Assessment: Report given to RN and Post -op Vital signs reviewed and stable  Post vital signs: Reviewed and stable  Last Vitals:  Vitals Value Taken Time  BP 150/84 05/29/20 1245  Temp    Pulse 79 05/29/20 1246  Resp 19 05/29/20 1246  SpO2 96 % 05/29/20 1246  Vitals shown include unvalidated device data.  Last Pain:  Vitals:   05/29/20 0600  TempSrc:   PainSc: 0-No pain         Complications: No complications documented.

## 2020-05-29 NOTE — Op Note (Signed)
Preoperative diagnosis: Clinically localized adenocarcinoma of the prostate (clinical stage T1c N0 M0)  Postoperative diagnosis: Clinically localized adenocarcinoma of the prostate (clinical stage T1c N0 M0)  Procedure:  1. Robotic assisted laparoscopic radical prostatectomy (bilateral nerve sparing) 2. Bilateral robotic assisted laparoscopic pelvic lymphadenectomy 3. Laparoscopic adhesiolysis 4. Primary repair of ventral hernia  Surgeon: Pryor Curia. M.D.  Assistant: Debbrah Alar, PA-C  An assistant was required for this surgical procedure.  The duties of the assistant included but were not limited to suctioning, passing suture, camera manipulation, retraction. This procedure would not be able to be performed without an Environmental consultant.  Resident: Dr. Ermelinda Das  Anesthesia: General  Complications: Serosal tear of small bowel - repaired  EBL: 150 mL  IVF:  1700 mL crystalloid  Specimens: 1. Prostate and seminal vesicles 2. Right pelvic lymph nodes 3. Left pelvic lymph nodes  Disposition of specimens: Pathology  Drains: 1. 20 Fr coude catheter 2. # 19 Blake pelvic drain  Indication: Joseph Matthews is a 59 y.o. year old patient with clinically localized prostate cancer.  After a thorough review of the management options for treatment of prostate cancer, he elected to proceed with surgical therapy and the above procedure(s).  We have discussed the potential benefits and risks of the procedure, side effects of the proposed treatment, the likelihood of the patient achieving the goals of the procedure, and any potential problems that might occur during the procedure or recuperation. Informed consent has been obtained.  Description of procedure:  The patient was taken to the operating room and a general anesthetic was administered. He was given preoperative antibiotics, placed in the dorsal lithotomy position, and prepped and draped in the usual sterile fashion. Next a  preoperative timeout was performed. A urethral catheter was placed into the bladder and a site was selected near the umbilicus for placement of the camera port. Considering his surgical history, the camera port incision was made just above his prior midline scar. This was placed using a standard open Hassan technique which allowed entry into the peritoneal cavity under direct vision and without difficulty. I was able to palpate adhesions inferiorly but the abdomen felt free of adhesions on either side of midline. An 8 mm robotic port was placed and a pneumoperitoneum established. The camera was then used to inspect the abdomen and there were extensive small bowel adhesion to the abdominal wall. The remaining abdominal ports were then carefully placed. 8 mm robotic ports were placed in the right lower quadrant, left lower quadrant, and far left lateral abdominal wall. A 5 mm port was placed in the right upper quadrant and a 12 mm port was placed in the right lateral abdominal wall for laparoscopic assistance. All ports were placed under direct vision without difficulty.  Using laparoscopic scissors, the abdominal adhesions were carefully taken down sharply off the abdominal wall.  There were multiple areas where the small intestine was densely attached to the abdominal wall.  In addition, there was a ventral hernia identified just lateral and to the left of the umbilicus.  A large amount of omentum and some small bowel was stuck up in the hernia.  This tissue was gently retracted down with bowel graspers and the omentum and small bowel were freed from the hernia. No apparent bowel injury was noted at this point. The surgical cart was then docked.   Utilizing the cautery scissors, the bladder was reflected posteriorly allowing entry into the space of Retzius and identification of the endopelvic  fascia and prostate. The periprostatic fat was then removed from the prostate allowing full exposure of the endopelvic  fascia. The endopelvic fascia was then incised from the apex back to the base of the prostate bilaterally and the underlying levator muscle fibers were swept laterally off the prostate thereby isolating the dorsal venous complex. The dorsal vein was then stapled and divided with a 45 mm Flex Echelon stapler. Attention then turned to the bladder neck which was divided anteriorly thereby allowing entry into the bladder and exposure of the urethral catheter. The catheter balloon was deflated and the catheter was brought into the operative field and used to retract the prostate anteriorly. The posterior bladder neck was then examined and was divided allowing further dissection between the bladder and prostate posteriorly until the vasa deferentia and seminal vessels were identified. The vasa deferentia were isolated, divided, and lifted anteriorly. The seminal vesicles were dissected down to their tips with care to control the seminal vascular arterial blood supply. These structures were then lifted anteriorly and the space between Denonvillier's fascia and the anterior rectum was developed with a combination of sharp and blunt dissection. This isolated the vascular pedicles of the prostate.  The lateral prostatic fascia was then sharply incised allowing release of the neurovascular bundles bilaterally. The vascular pedicles of the prostate were then ligated with Weck clips between the prostate and neurovascular bundles and divided with sharp cold scissor dissection resulting in neurovascular bundle preservation. The neurovascular bundles were then separated off the apex of the prostate and urethra bilaterally.  The urethra was then sharply transected allowing the prostate specimen to be disarticulated. The pelvis was copiously irrigated and hemostasis was ensured.  A running 3-0 vicryl suture was used to suture ligate the left vascular pedicle below the clips that had been placed for additional hemostasis. There  was no evidence for rectal injury.  Attention then turned to the right pelvic sidewall. The fibrofatty tissue between the external iliac vein, confluence of the iliac vessels, hypogastric artery, and Cooper's ligament was dissected free from the pelvic sidewall with care to preserve the obturator nerve. Weck clips were used for lymphostasis and hemostasis. An identical procedure was performed on the contralateral side and the lymphatic packets were removed for permanent pathologic analysis.  There were some grossly prominent lymph nodes noted.  Attention then turned to the urethral anastomosis. A 2-0 Vicryl slip knot was placed between Denonvillier's fascia, the posterior bladder neck, and the posterior urethra to reapproximate these structures. A double-armed 3-0 Monocryl suture was then used to perform a 360 running tension-free anastomosis between the bladder neck and urethra. A new urethral catheter was then placed into the bladder and irrigated. There were no blood clots within the bladder and the anastomosis appeared to be watertight. A #19 Blake drain was then brought through the left lateral 8 mm port site and positioned appropriately within the pelvis. It was secured to the skin with a nylon suture.  Before undocking, the small intestine was again carefully examined.  There was noted to be a small serosal tear in the small intestine in one area well away from the mesenteric side of the bowel.  Therefore, 3 imbricating 3-0 silk sutures were placed in an interrupted fashion to close this serosal tear. The surgical cart was then undocked. The right lateral 12 mm port site was closed at the fascial level with a 0 Vicryl suture placed laparoscopically. All remaining ports were then removed under direct vision. The prostate specimen was  removed intact within the Endopouch retrieval bag via the periumbilical camera port site. This fascial opening was then opened inferiorly to include the previously noted  abdominal wall hernia defect which measured about 2.5 cm in diameter.  This was closed with a figure of eight #1 Novofil.  The entire fascial defect was then closed with interrupted figure of eight #1 Novofil sutures. A 3-0 vicryl was used to reapproximate the subcutaneous tissue.  0.25% Marcaine was then injected into all port sites and all incisions were reapproximated at the skin level with 4-0 Monocryl subcuticular sutures and Dermabond. The patient appeared to tolerate the procedure well and without complications. The patient was able to be extubated and transferred to the recovery unit in satisfactory condition.   Pryor Curia MD

## 2020-05-29 NOTE — Anesthesia Preprocedure Evaluation (Signed)
Anesthesia Evaluation  Patient identified by MRN, date of birth, ID band Patient awake    Reviewed: Allergy & Precautions, NPO status , Patient's Chart, lab work & pertinent test results  Airway Mallampati: II  TM Distance: >3 FB     Dental   Pulmonary    breath sounds clear to auscultation       Cardiovascular hypertension,  Rhythm:Regular Rate:Normal     Neuro/Psych    GI/Hepatic   Endo/Other  diabetes  Renal/GU      Musculoskeletal   Abdominal   Peds  Hematology   Anesthesia Other Findings   Reproductive/Obstetrics                             Anesthesia Physical Anesthesia Plan  ASA: III  Anesthesia Plan: General   Post-op Pain Management:    Induction: Intravenous  PONV Risk Score and Plan: 3 and 2 and Ondansetron, Dexamethasone and Midazolam  Airway Management Planned:   Additional Equipment:   Intra-op Plan:   Post-operative Plan: Possible Post-op intubation/ventilation  Informed Consent: I have reviewed the patients History and Physical, chart, labs and discussed the procedure including the risks, benefits and alternatives for the proposed anesthesia with the patient or authorized representative who has indicated his/her understanding and acceptance.     Dental advisory given  Plan Discussed with: Anesthesiologist and CRNA  Anesthesia Plan Comments:         Anesthesia Quick Evaluation

## 2020-05-29 NOTE — Progress Notes (Signed)
Patient ID: Joseph Matthews, male   DOB: [birth year], 59 y.o.   MRN: [id number]  Post-op note  Subjective: The patient is doing well.  No complaints.  Objective: Vital signs in last 24 hours: Temp:  [97.6 F (36.4 C)-99 F (37.2 C)] 99 F (37.2 C) (12/13 1756) Pulse Rate:  [65-95] 94 (12/13 1756) Resp:  [16-24] 20 (12/13 1756) BP: (150-178)/(84-103) 160/91 (12/13 1756) SpO2:  [92 %-100 %] 96 % (12/13 1756) Weight:  [93.9 kg-94.3 kg] 94.3 kg (12/13 1537)  Intake/Output from previous day: No intake/output data recorded. Intake/Output this shift: Total I/O In: 3100 [I.V.:2000; IV Piggyback:1100] Out: 1610 [Urine:1350; Drains:110; Blood:150]  Physical Exam:  General: Alert and oriented. Abdomen: Soft, Nondistended. Incisions: Clean and dry. GU: Urine red tinged.  Lab Results: Recent Labs    05/29/20 1330  HGB 15.9  HCT 45.4    Assessment/Plan: POD#0   1) Continue to monitor, ambulate, IS  Joseph Matthews. MD   LOS: 0 days   Joseph Matthews 05/29/2020, 6:07 PM

## 2020-05-29 NOTE — Anesthesia Procedure Notes (Signed)
Procedure Name: Intubation Date/Time: 05/29/2020 7:31 AM Performed by: Raenette Rover, CRNA Pre-anesthesia Checklist: Patient identified, Emergency Drugs available, Suction available and Patient being monitored Patient Re-evaluated:Patient Re-evaluated prior to induction Oxygen Delivery Method: Circle system utilized Preoxygenation: Pre-oxygenation with 100% oxygen Induction Type: IV induction Laryngoscope Size: Mac and 4 Grade View: Grade I Tube type: Oral Tube size: 7.5 mm Number of attempts: 1 Airway Equipment and Method: Stylet Placement Confirmation: ETT inserted through vocal cords under direct vision,  positive ETCO2 and breath sounds checked- equal and bilateral Secured at: 22 cm Tube secured with: Tape Dental Injury: Teeth and Oropharynx as per pre-operative assessment

## 2020-05-29 NOTE — Discharge Instructions (Signed)

## 2020-05-30 ENCOUNTER — Encounter (HOSPITAL_COMMUNITY): Payer: Self-pay | Admitting: Urology

## 2020-05-30 DIAGNOSIS — C61 Malignant neoplasm of prostate: Secondary | ICD-10-CM | POA: Diagnosis not present

## 2020-05-30 LAB — GLUCOSE, CAPILLARY
Glucose-Capillary: 124 mg/dL — ABNORMAL HIGH (ref 70–99)
Glucose-Capillary: 126 mg/dL — ABNORMAL HIGH (ref 70–99)
Glucose-Capillary: 131 mg/dL — ABNORMAL HIGH (ref 70–99)
Glucose-Capillary: 134 mg/dL — ABNORMAL HIGH (ref 70–99)

## 2020-05-30 LAB — HEMOGLOBIN AND HEMATOCRIT, BLOOD
HCT: 36.3 % — ABNORMAL LOW (ref 39.0–52.0)
HCT: 40.2 % (ref 39.0–52.0)
Hemoglobin: 12.4 g/dL — ABNORMAL LOW (ref 13.0–17.0)
Hemoglobin: 13.9 g/dL (ref 13.0–17.0)

## 2020-05-30 MED ORDER — BISACODYL 10 MG RE SUPP
10.0000 mg | Freq: Once | RECTAL | Status: AC
  Administered 2020-05-30: 10:00:00 10 mg via RECTAL
  Filled 2020-05-30: qty 1

## 2020-05-30 MED ORDER — TRAMADOL HCL 50 MG PO TABS
50.0000 mg | ORAL_TABLET | Freq: Four times a day (QID) | ORAL | Status: DC | PRN
  Administered 2020-05-30: 50 mg via ORAL
  Filled 2020-05-30: qty 1

## 2020-05-30 NOTE — Progress Notes (Signed)
Pt and fiance given and explained discharge instructions. All questions answered. Pt educated on how to care for the foley catheter and drain site. Necessary supplies given to pt before discharge for drain site. Extra foley supplies also given. IVs removed. Pt dressed in personal clothing. Pt taken to main entrance via wheelchair.

## 2020-05-30 NOTE — Plan of Care (Signed)
  Problem: Education: Goal: Knowledge of General Education information will improve Description: Including pain rating scale, medication(s)/side effects and non-pharmacologic comfort measures Outcome: Progressing   Problem: Clinical Measurements: Goal: Respiratory complications will improve Outcome: Progressing Goal: Cardiovascular complication will be avoided Outcome: Progressing   Problem: Activity: Goal: Risk for activity intolerance will decrease Outcome: Progressing   Problem: Nutrition: Goal: Adequate nutrition will be maintained Outcome: Progressing   Problem: Coping: Goal: Level of anxiety will decrease Outcome: Progressing   Problem: Elimination: Goal: Will not experience complications related to urinary retention Outcome: Progressing   Problem: Pain Managment: Goal: General experience of comfort will improve Outcome: Progressing   Problem: Safety: Goal: Ability to remain free from injury will improve Outcome: Progressing   Problem: Skin Integrity: Goal: Risk for impaired skin integrity will decrease Outcome: Progressing   Problem: Education: Goal: Knowledge of the procedure and recovery process will improve Outcome: Progressing   Problem: Pain Management: Goal: General experience of comfort will improve Outcome: Progressing   Problem: Urinary Elimination: Goal: Ability to avoid or minimize complications of infection will improve Outcome: Progressing Goal: Ability to achieve and maintain urine output will improve Outcome: Progressing

## 2020-05-30 NOTE — Discharge Summary (Signed)
  Date of admission: 05/29/2020  Date of discharge: 05/30/2020  Admission diagnosis: Prostate Cancer  Discharge diagnosis: Prostate Cancer  History and Physical: For full details, please see admission history and physical. Briefly, Joseph Matthews is a 59 y.o. gentleman with localized prostate cancer.  After discussing management/treatment options, he elected to proceed with surgical treatment.  Hospital Course: Joseph Matthews was taken to the operating room on 05/29/2020 and underwent a robotic assisted laparoscopic radical prostatectomy. He tolerated this procedure well and without complications. Postoperatively, he was able to be transferred to a regular hospital room following recovery from anesthesia.  He was able to begin ambulating the night of surgery. He remained hemodynamically stable overnight.  He had excellent urine output with appropriately minimal output from his pelvic drain and his pelvic drain was removed on POD #1.  He was transitioned to oral pain medication, tolerated a clear liquid diet, and had met all discharge criteria and was able to be discharged home later on POD#1.  Laboratory values: Recent Labs    05/29/20 1330 05/30/20 0510 05/30/20 1036  HGB 15.9 12.4* 13.9  HCT 45.4 36.3* 40.2    Disposition: Home  Discharge instruction: He was instructed to be ambulatory but to refrain from heavy lifting, strenuous activity, or driving. He was instructed on urethral catheter care.  Discharge medications:   Allergies as of 05/30/2020      Reactions   Hydrochlorothiazide    Gout flares    Penicillin G Hives, Swelling   Pineapple Swelling   Back of throat swells       Medication List    STOP taking these medications   naproxen sodium 220 MG tablet Commonly known as: ALEVE     TAKE these medications   amLODipine-olmesartan 10-40 MG tablet Commonly known as: AZOR Take 1 tablet by mouth daily.   metFORMIN 500 MG tablet Commonly known as: GLUCOPHAGE Take 500 mg by  mouth daily.   nebivolol 2.5 MG tablet Commonly known as: BYSTOLIC Take 5 mg by mouth every evening.   sulfamethoxazole-trimethoprim 800-160 MG tablet Commonly known as: BACTRIM DS Take 1 tablet by mouth 2 (two) times daily. Start the day prior to foley removal appointment   traMADol 50 MG tablet Commonly known as: Ultram Take 1-2 tablets (50-100 mg total) by mouth every 6 (six) hours as needed for moderate pain or severe pain.       Followup: He will followup in 1 week for catheter removal and to discuss his surgical pathology results.

## 2020-05-30 NOTE — Progress Notes (Signed)
Patient ID: Joseph Matthews, male   DOB: July 14, 1960, 59 y.o.   MRN: [id number]  1 Day Post-Op Subjective: The patient is doing well.  No nausea or vomiting. Pain is adequately controlled.  Objective: Vital signs in last 24 hours: Temp:  [97.6 F (36.4 C)-99 F (37.2 C)] 98.4 F (36.9 C) (12/14 0539) Pulse Rate:  [68-95] 68 (12/14 0539) Resp:  [16-24] 18 (12/14 0539) BP: (143-176)/(83-100) 143/84 (12/14 0539) SpO2:  [91 %-100 %] 92 % (12/14 0539) Weight:  [94.3 kg] 94.3 kg (12/13 1537)  Intake/Output from previous day: 12/13 0701 - 12/14 0700 In: 6631.7 [P.O.:1660; I.V.:3771.7; IV Piggyback:1200] Out: 3590 [Urine:3275; Drains:165; Blood:150] Intake/Output this shift: No intake/output data recorded.  Physical Exam:  General: Alert and oriented. CV: RRR Lungs: Clear bilaterally. GI: Soft, Nondistended. Incisions: Clean, dry, and intact Urine: Clear Extremities: Nontender, no erythema, no edema.  Lab Results: Recent Labs    05/29/20 1330 05/30/20 0510  HGB 15.9 12.4*  HCT 45.4 36.3*      Assessment/Plan: POD# 1 s/p robotic prostatectomy.  1) SL IVF 2) Ambulate, Incentive spirometry 3) Transition to oral pain medication 4) Dulcolax suppository 5) D/C pelvic drain 6) Plan for likely discharge later today   Joseph Matthews. MD   LOS: 0 days   Joseph Matthews 05/30/2020, 7:29 AM

## 2020-05-30 NOTE — Plan of Care (Signed)
  Problem: Education: Goal: Knowledge of General Education information will improve Description: Including pain rating scale, medication(s)/side effects and non-pharmacologic comfort measures Outcome: Progressing   Problem: Health Behavior/Discharge Planning: Goal: Ability to manage health-related needs will improve Outcome: Progressing   Problem: Clinical Measurements: Goal: Ability to maintain clinical measurements within normal limits will improve Outcome: Progressing Goal: Will remain free from infection Outcome: Progressing Goal: Diagnostic test results will improve Outcome: Progressing   Problem: Activity: Goal: Risk for activity intolerance will decrease Outcome: Progressing   Problem: Nutrition: Goal: Adequate nutrition will be maintained Outcome: Progressing   Problem: Coping: Goal: Level of anxiety will decrease Outcome: Progressing   Problem: Pain Managment: Goal: General experience of comfort will improve Outcome: Progressing   Problem: Education: Goal: Knowledge of the procedure and recovery process will improve Outcome: Progressing   Problem: Bowel/Gastric: Goal: Gastrointestinal status for postoperative course will improve Outcome: Progressing   Problem: Pain Management: Goal: General experience of comfort will improve Outcome: Progressing

## 2020-06-02 LAB — SURGICAL PATHOLOGY

## 2020-07-12 ENCOUNTER — Ambulatory Visit: Payer: BC Managed Care – PPO | Attending: Family

## 2020-07-12 DIAGNOSIS — Z23 Encounter for immunization: Secondary | ICD-10-CM

## 2020-11-02 NOTE — Progress Notes (Signed)
   Covid-19 Vaccination Clinic  Name:  Joseph Matthews    MRN: [id number] DOB: [birth year]  11/02/2020  [name] was observed post Covid-19 immunization for 15 minutes without incident. He was provided with Vaccine Information Sheet and instruction to access the V-Safe system.   [name] was instructed to call 911 with any severe reactions post vaccine: [name] Kitchen Difficulty breathing  . Swelling of face and throat  . A fast heartbeat  . A bad rash all over body  . Dizziness and weakness   Immunizations Administered    Name Date Dose VIS Date Route   Moderna Covid-19 Booster Vaccine 07/12/2020 10:00 AM 0.25 mL 04/05/2020 Intramuscular   Manufacturer: Moderna   Lot: [id number]   [place]: [id number]

## 2021-11-22 IMAGING — NM NM BONE WHOLE BODY
2 series · 2 of 2 positions shown · non-contrast
Comparison: March 29, 2020.

CLINICAL DATA: History of prostate cancer.

EXAM:
NUCLEAR MEDICINE WHOLE BODY BONE SCAN
TECHNIQUE: Whole body anterior and posterior images were obtained approximately
3 hours after intravenous injection of radiopharmaceutical.
RADIOPHARMACEUTICALS:  21.1 mCi Dechnetium-DDm MDP IV

[Series 1: whole body · 2.66mm/px · 1 of 1 slices shown (1 of 2)]
[im 1/1]
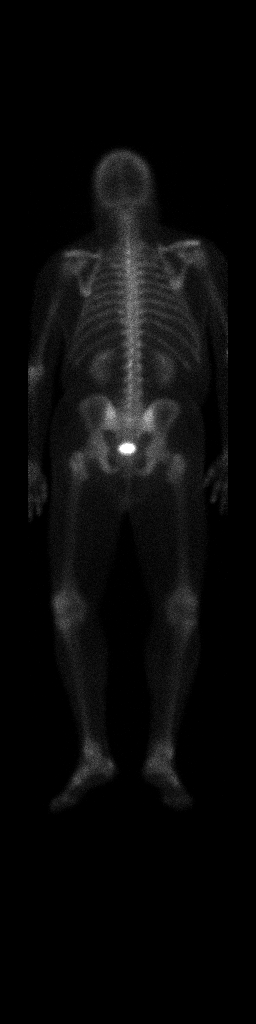

[Series 1: whole body · 2.66mm/px · 1 of 1 slices shown (2 of 2)]
[im 1/1]
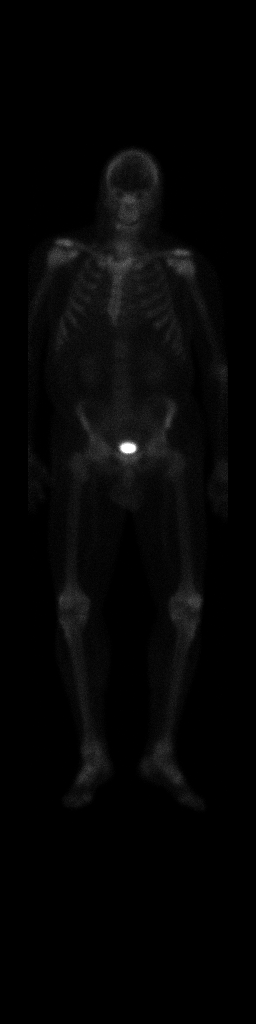

[2 of 2 positions shown; findings below may reference images not displayed]

FINDINGS: No abnormal uptake is noted on this exam.
IMPRESSION: No definite scintigraphic evidence of osseous metastases.
